# Patient Record
Sex: Male | Born: 1980 | Race: White | Hispanic: Yes | Marital: Married | Smoking: Current some day smoker
Health system: Southern US, Community
[De-identification: ages and names within clinical notes are randomized; demographics above are authoritative.]

## PROBLEM LIST (undated history)

## (undated) DIAGNOSIS — I10 Essential (primary) hypertension: Secondary | ICD-10-CM

---

## 2013-01-23 ENCOUNTER — Emergency Department (HOSPITAL_COMMUNITY): Payer: No Typology Code available for payment source

## 2013-01-23 ENCOUNTER — Emergency Department (HOSPITAL_COMMUNITY)
Admission: EM | Admit: 2013-01-23 | Discharge: 2013-01-23 | Disposition: A | Discharge status: Home / Self Care | Payer: No Typology Code available for payment source | Attending: Emergency Medicine | Admitting: Emergency Medicine

## 2013-01-23 ENCOUNTER — Encounter (HOSPITAL_COMMUNITY): Payer: Self-pay | Admitting: Emergency Medicine

## 2013-01-23 DIAGNOSIS — I10 Essential (primary) hypertension: Secondary | ICD-10-CM | POA: Insufficient documentation

## 2013-01-23 DIAGNOSIS — S46909A Unspecified injury of unspecified muscle, fascia and tendon at shoulder and upper arm level, unspecified arm, initial encounter: Secondary | ICD-10-CM | POA: Insufficient documentation

## 2013-01-23 DIAGNOSIS — Y9389 Activity, other specified: Secondary | ICD-10-CM | POA: Insufficient documentation

## 2013-01-23 DIAGNOSIS — Y92410 Unspecified street and highway as the place of occurrence of the external cause: Secondary | ICD-10-CM | POA: Insufficient documentation

## 2013-01-23 DIAGNOSIS — S4980XA Other specified injuries of shoulder and upper arm, unspecified arm, initial encounter: Secondary | ICD-10-CM | POA: Insufficient documentation

## 2013-01-23 DIAGNOSIS — M62838 Other muscle spasm: Secondary | ICD-10-CM | POA: Insufficient documentation

## 2013-01-23 DIAGNOSIS — M25512 Pain in left shoulder: Secondary | ICD-10-CM

## 2013-01-23 HISTORY — DX: Essential (primary) hypertension: I10

## 2013-01-23 MED ORDER — OXYCODONE-ACETAMINOPHEN 5-325 MG PO TABS: 1.0000 | ORAL_TABLET | ORAL | Status: AC | PRN

## 2013-01-23 MED ORDER — METHOCARBAMOL 500 MG PO TABS: 500.0000 mg | ORAL_TABLET | Freq: Two times a day (BID) | ORAL | Status: AC | PRN
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
## 2013-01-23 NOTE — ED Notes (Signed)
Patient transported to X-ray 

## 2013-01-24 NOTE — ED Provider Notes (Signed)
Medical screening examination/treatment/procedure(s) were performed by non-physician practitioner and as supervising physician I was immediately available for consultation/collaboration.    Nelia Shi, MD 01/24/13 [phone]

## 2018-08-06 ENCOUNTER — Other Ambulatory Visit: Payer: Self-pay

## 2018-08-06 ENCOUNTER — Emergency Department (HOSPITAL_COMMUNITY)
Admission: EM | Admit: 2018-08-06 | Discharge: 2018-08-06 | Disposition: A | Discharge status: Home / Self Care | Payer: Self-pay | Attending: Emergency Medicine | Admitting: Emergency Medicine

## 2018-08-06 ENCOUNTER — Emergency Department (HOSPITAL_COMMUNITY): Payer: Self-pay

## 2018-08-06 ENCOUNTER — Encounter (HOSPITAL_COMMUNITY): Payer: Self-pay | Admitting: *Deleted

## 2018-08-06 DIAGNOSIS — F17210 Nicotine dependence, cigarettes, uncomplicated: Secondary | ICD-10-CM | POA: Insufficient documentation

## 2018-08-06 DIAGNOSIS — R05 Cough: Secondary | ICD-10-CM | POA: Insufficient documentation

## 2018-08-06 DIAGNOSIS — Z79899 Other long term (current) drug therapy: Secondary | ICD-10-CM | POA: Insufficient documentation

## 2018-08-06 DIAGNOSIS — I10 Essential (primary) hypertension: Secondary | ICD-10-CM | POA: Insufficient documentation

## 2018-08-06 DIAGNOSIS — R059 Cough, unspecified: Secondary | ICD-10-CM

## 2018-08-06 MED ORDER — BENZONATATE 100 MG PO CAPS: 100.0000 mg | ORAL_CAPSULE | Freq: Three times a day (TID) | ORAL | 0 refills | Status: AC | PRN

## 2018-08-06 MED ORDER — GUAIFENESIN-CODEINE 100-10 MG/5ML PO SOLN: 10.0000 mL | Freq: Once | ORAL | Status: DC

## 2018-08-06 MED ORDER — DEXAMETHASONE 4 MG PO TABS
8.0000 mg | ORAL_TABLET | Freq: Once | ORAL | Status: AC
  Administered 2018-08-06: 8 mg via ORAL
  Filled 2018-08-06: qty 2

## 2018-08-06 MED ORDER — HYDROCODONE-HOMATROPINE 5-1.5 MG/5ML PO SYRP
5.0000 mL | ORAL_SOLUTION | Freq: Once | ORAL | Status: AC
  Administered 2018-08-06: 5 mL via ORAL
  Filled 2018-08-06: qty 5

## 2018-08-06 NOTE — ED Notes (Signed)
Declined W/C at D/C and was escorted to lobby by RN. 

## 2018-08-06 NOTE — ED Triage Notes (Signed)
PT now reports he has flu like Sx's
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
## 2018-08-06 NOTE — ED Notes (Signed)
Patient transported to X-ray 

## 2018-08-13 NOTE — ED Provider Notes (Signed)
[facility] EMERGENCY DEPARTMENT Provider Note   CSN: [id number] Arrival date & time: 08/06/18  1429     History   Chief Complaint Chief Complaint  Patient presents with  . Shortness of Breath    HPI Juan Stafford is a 37 y.o. male.  HPI   37 year old male with dyspnea.  And feeling short of breath while at work.  He states that he was inhaling heavy exhaust at the time symptoms started.  Denies any pain.  Feels achy and generally weak.  Occasional nonproductive cough.  No unusual leg pain or swelling.  Past Medical History:  Diagnosis Date  . Hypertension     There are no active problems to display for this patient.   History reviewed. No pertinent surgical history.      Home Medications    Prior to Admission medications   Medication Sig Start Date End Date Taking? Authorizing Provider  benzonatate (TESSALON) 100 MG capsule Take 1 capsule (100 mg total) by mouth 3 (three) times daily as needed for cough. 08/06/18   Raeford Razor, MD  methocarbamol (ROBAXIN) 500 MG tablet Take 1 tablet (500 mg total) by mouth 2 (two) times daily as needed. 01/23/13   Garlon Hatchet, PA-C  oxyCODONE-acetaminophen (PERCOCET/ROXICET) 5-325 MG per tablet Take 1 tablet by mouth every 4 (four) hours as needed for pain. 01/23/13   Garlon Hatchet, PA-C    Family History History reviewed. No pertinent family history.  Social History Social History   Tobacco Use  . Smoking status: Current Some Day Smoker  . Smokeless tobacco: Never Used  Substance Use Topics  . Alcohol use: Not Currently  . Drug use: Not Currently     Allergies   Patient has no known allergies.   Review of Systems Review of Systems All systems reviewed and negative, other than as noted in HPI.   Physical Exam Updated Vital Signs BP (!) 158/112 (BP Location: Right Arm)   Pulse 71   Temp 97.7 F (36.5 C) (Oral)   Resp (!) 22   Ht 5\' 6"  (1.676 m)   Wt 65.8 kg   SpO2 99%   BMI  23.40 kg/m   Physical Exam Vitals signs and nursing note reviewed.  Constitutional:      General: He is not in acute distress.    Appearance: He is well-developed.  HENT:     Head: Normocephalic and atraumatic.  Eyes:     General:        Right eye: No discharge.        Left eye: No discharge.     Conjunctiva/sclera: Conjunctivae normal.  Neck:     Musculoskeletal: Neck supple.  Cardiovascular:     Rate and Rhythm: Normal rate and regular rhythm.     Heart sounds: Normal heart sounds. No murmur. No friction rub. No gallop.   Pulmonary:     Effort: Pulmonary effort is normal. No respiratory distress.     Breath sounds: Normal breath sounds.  Abdominal:     General: There is no distension.     Palpations: Abdomen is soft.     Tenderness: There is no abdominal tenderness.  Musculoskeletal:        General: No tenderness.     Comments: Lower extremities symmetric as compared to each other. No calf tenderness. Negative Homan's. No palpable cords.   Skin:    General: Skin is warm and dry.  Neurological:     Mental Status: He is alert.  Psychiatric:        Behavior: Behavior normal.        Thought Content: Thought content normal.      ED Treatments / Results  Labs (all labs ordered are listed, but only abnormal results are displayed) Labs Reviewed - No data to display  EKG None  Radiology No results found.   Dg Chest 2 View  Result Date: 08/06/2018 CLINICAL DATA:  Cough and shortness of breath EXAM: CHEST - 2 VIEW COMPARISON:  None. FINDINGS: The heart size and mediastinal contours are within normal limits. Both lungs are clear. The visualized skeletal structures are unremarkable. IMPRESSION: No active cardiopulmonary disease. Electronically Signed   By: Gerome Samavid  Williams III M.D   On: 08/06/2018 15:52     Procedures Procedures (including critical care time)  Medications Ordered in ED Medications  dexamethasone (DECADRON) tablet 8 mg (8 mg Oral Given 08/06/18 1601)   HYDROcodone-homatropine (HYCODAN) 5-1.5 MG/5ML syrup 5 mL (5 mLs Oral Given 08/06/18 1601)     Initial Impression / Assessment and Plan / ED Course  I have reviewed the triage vital signs and the nursing notes.  Pertinent labs & imaging results that were available during my care of the patient were reviewed by me and considered in my medical decision making (see chart for details).     37 year old male with dyspnea and cough.  May be a mild pneumonitis related to inhaling heavy exhaust fumes.  Perhaps some very faint wheezing on exam but all in all he looks comfortable and has no increased work of breathing.  Chest x-ray without acute abnormality.  He is afebrile and he medically stable.  No signs or symptoms of DVT.  Final Clinical Impressions(s) / ED Diagnoses   Final diagnoses:  Cough    ED Discharge Orders         Ordered    benzonatate (TESSALON) 100 MG capsule  3 times daily PRN     08/06/18 1601           Raeford RazorKohut, Sammantha Mehlhaff, MD 08/13/18 [phone]

## 2019-10-05 IMAGING — DX DG CHEST 2V
2 series · 2 of 2 positions shown · non-contrast
Comparison: None.

CLINICAL DATA: Cough and shortness of breath

EXAM:
CHEST - 2 VIEW

[chest pa]
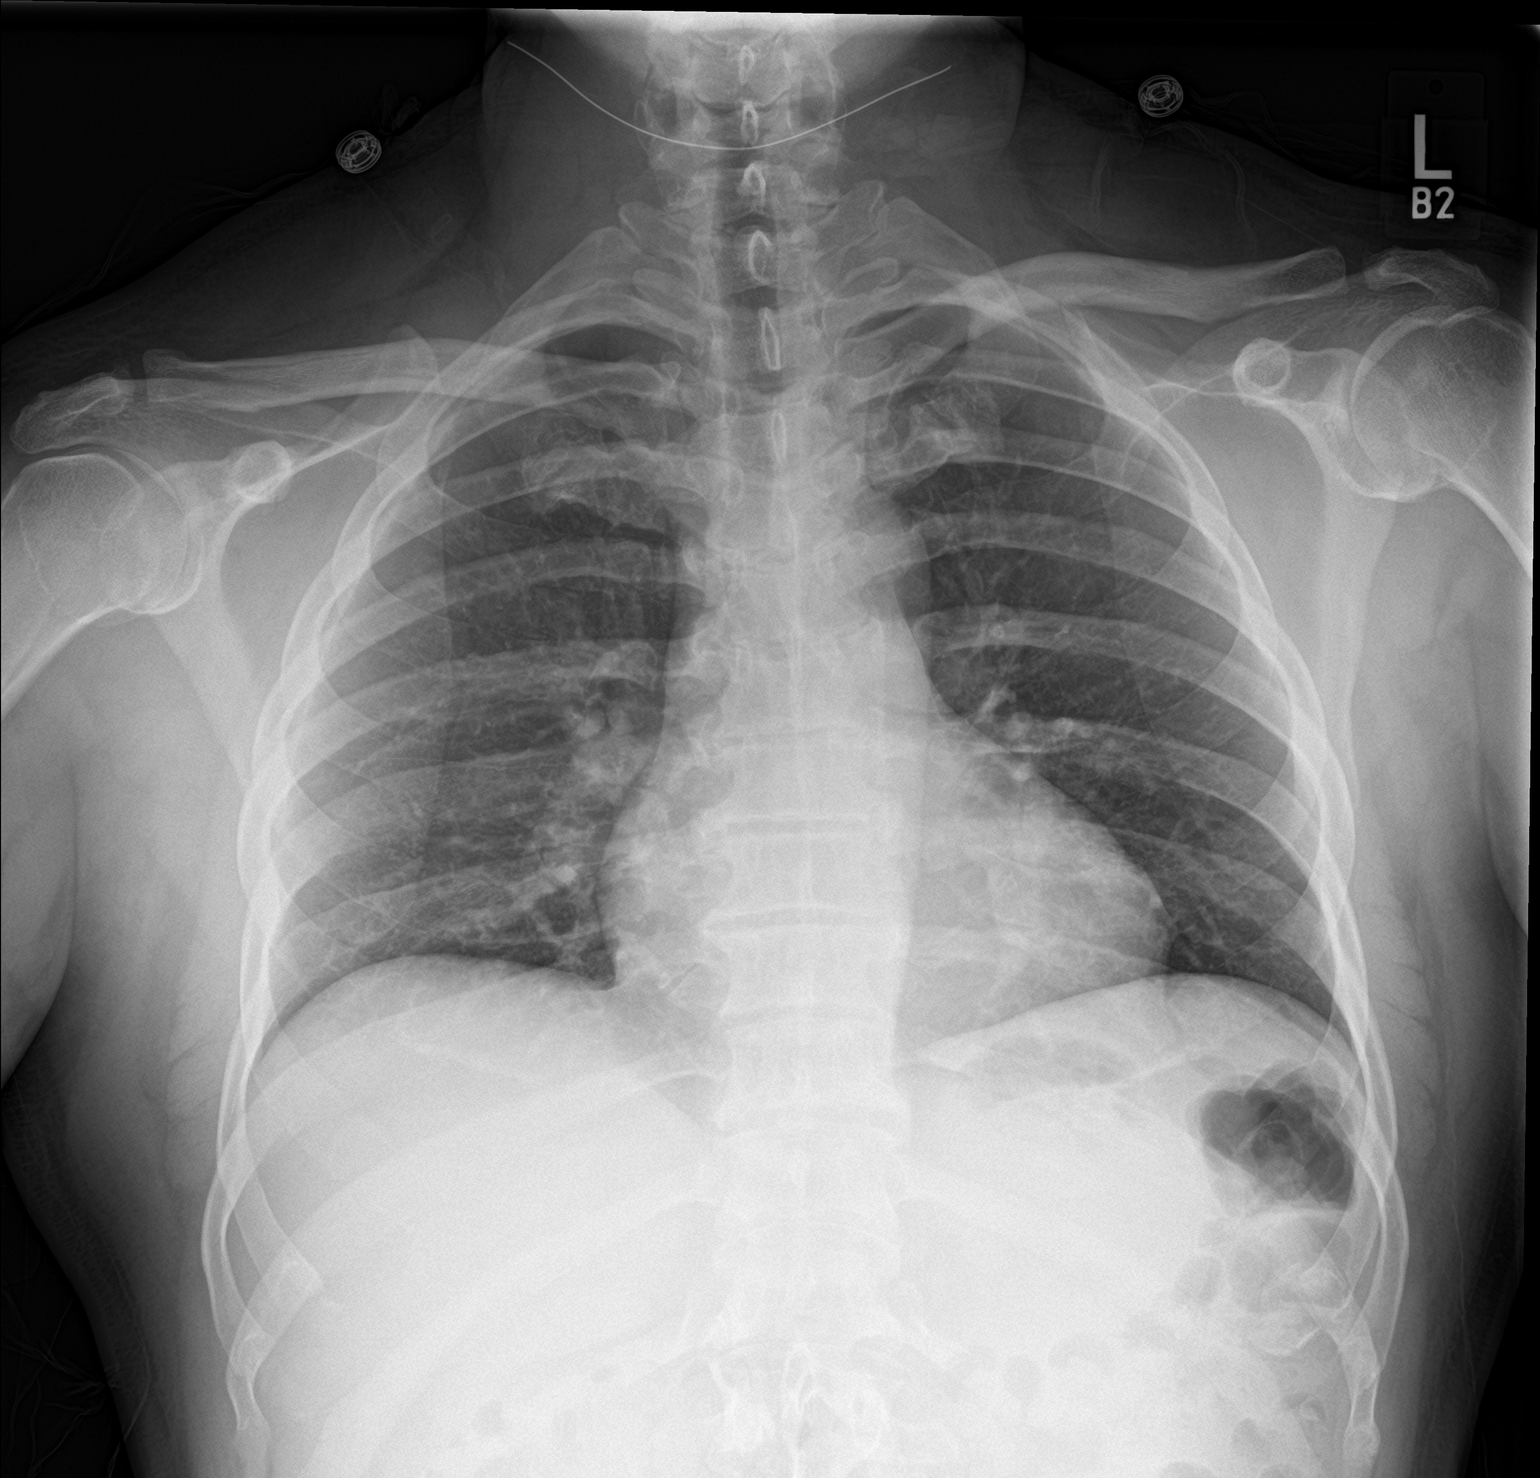

[chest lat]
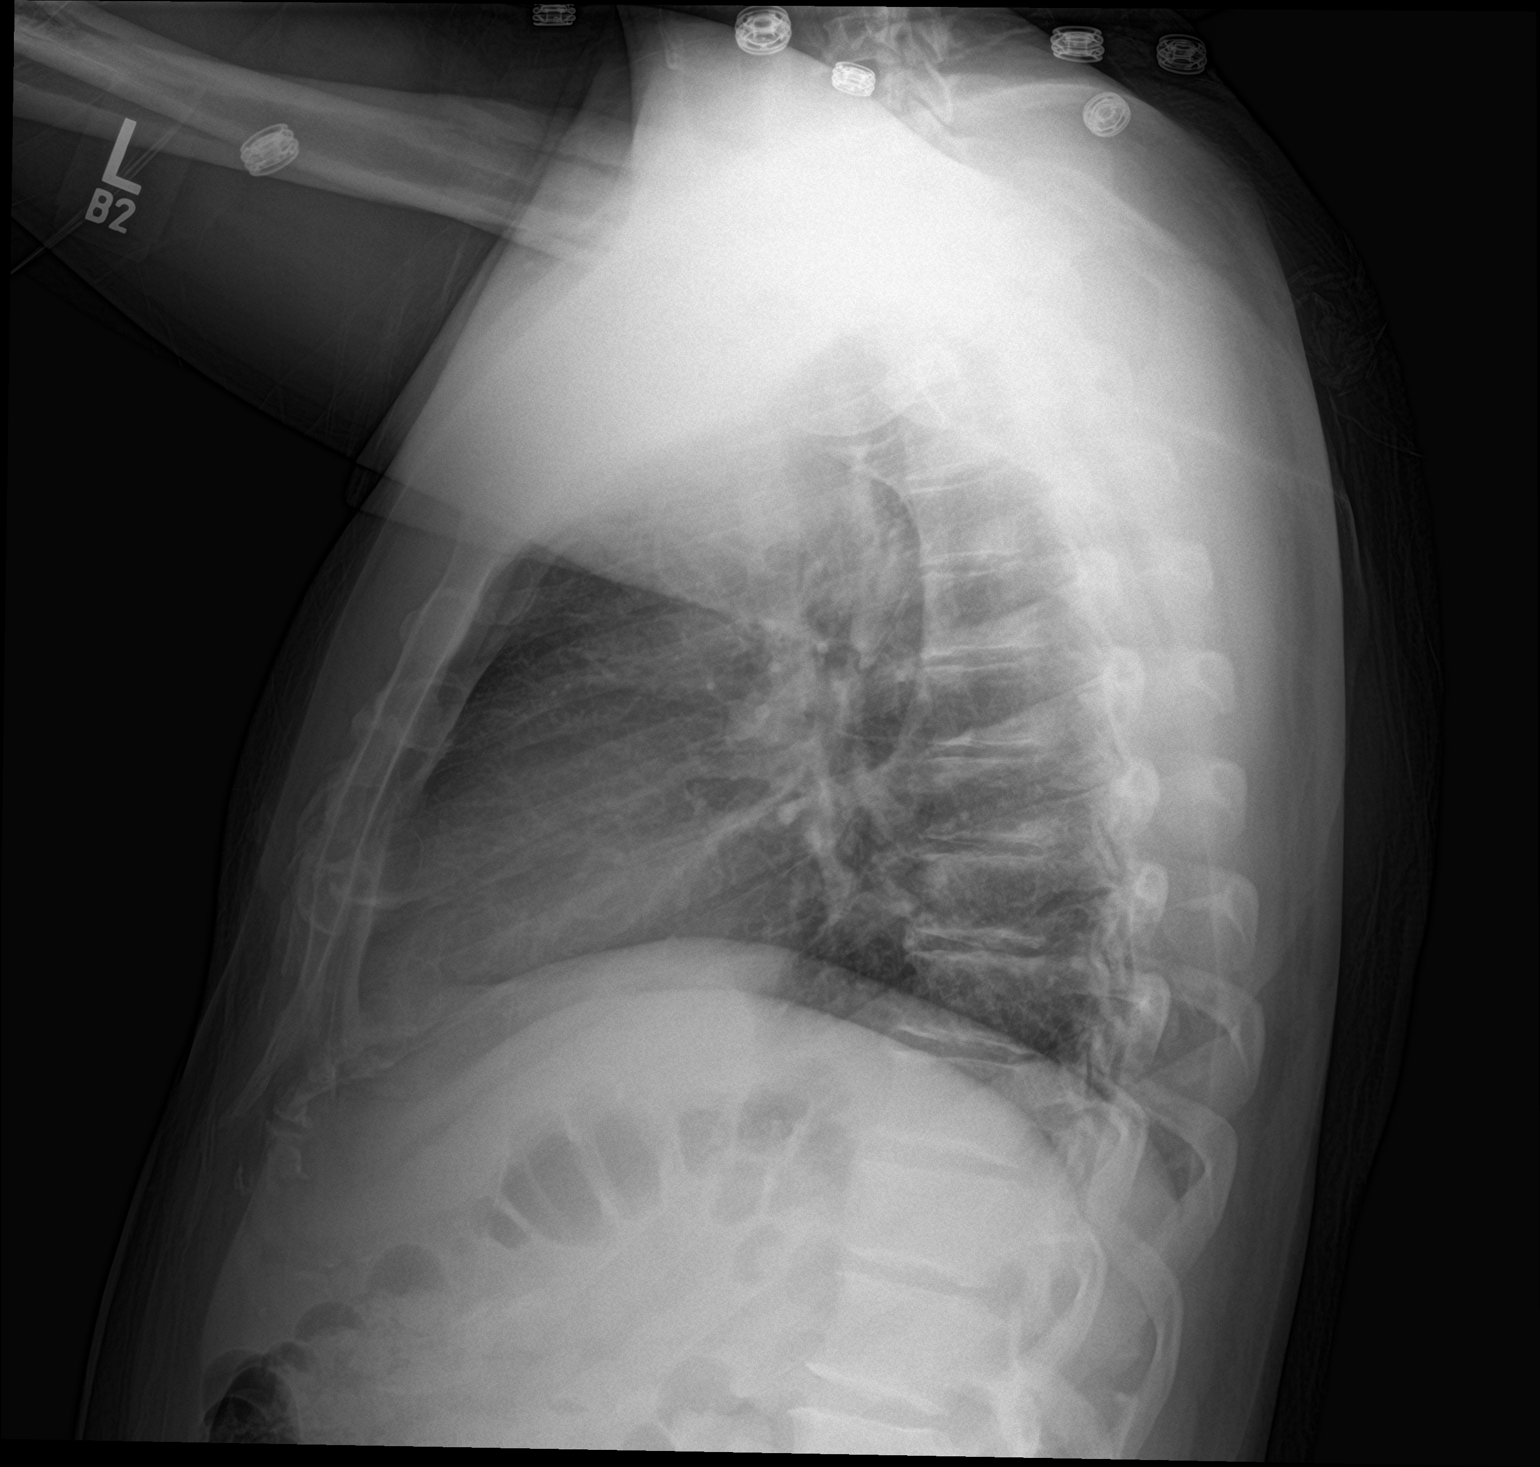

[2 of 2 positions shown; findings below may reference images not displayed]

FINDINGS: The heart size and mediastinal contours are within normal limits.
Both lungs are clear. The visualized skeletal structures are
unremarkable.
IMPRESSION: No active cardiopulmonary disease.
